# Patient Record
Sex: Female | Born: 1975 | Race: White | Hispanic: No | Marital: Married | Smoking: Never smoker
Health system: Southern US, Community
[De-identification: ages and names within clinical notes are randomized; demographics above are authoritative.]

## PROBLEM LIST (undated history)

## (undated) DIAGNOSIS — C801 Malignant (primary) neoplasm, unspecified: Secondary | ICD-10-CM

## (undated) HISTORY — PX: OTHER SURGICAL HISTORY: SHX169

---

## 2000-03-15 ENCOUNTER — Other Ambulatory Visit: Admission: RE | Admit: 2000-03-15 | Discharge: 2000-03-15 | Payer: Self-pay | Admitting: Obstetrics and Gynecology

## 2000-08-09 ENCOUNTER — Ambulatory Visit (HOSPITAL_COMMUNITY): Admission: RE | Admit: 2000-08-09 | Discharge: 2000-08-09 | Payer: Self-pay | Admitting: Obstetrics and Gynecology

## 2000-11-03 ENCOUNTER — Encounter: Payer: Self-pay | Admitting: Obstetrics and Gynecology

## 2000-11-03 ENCOUNTER — Inpatient Hospital Stay (HOSPITAL_COMMUNITY): Admission: AD | Admit: 2000-11-03 | Discharge: 2000-11-06 | Payer: Self-pay | Admitting: Obstetrics and Gynecology

## 2001-05-14 ENCOUNTER — Other Ambulatory Visit: Admission: RE | Admit: 2001-05-14 | Discharge: 2001-05-14 | Payer: Self-pay | Admitting: Obstetrics and Gynecology

## 2002-03-08 ENCOUNTER — Inpatient Hospital Stay (HOSPITAL_COMMUNITY): Admission: AD | Admit: 2002-03-08 | Discharge: 2002-03-08 | Payer: Self-pay | Admitting: Obstetrics and Gynecology

## 2002-03-08 ENCOUNTER — Encounter: Payer: Self-pay | Admitting: Obstetrics and Gynecology

## 2002-07-22 ENCOUNTER — Ambulatory Visit (HOSPITAL_COMMUNITY): Admission: RE | Admit: 2002-07-22 | Discharge: 2002-07-22 | Payer: Self-pay | Admitting: Obstetrics and Gynecology

## 2002-10-10 ENCOUNTER — Inpatient Hospital Stay (HOSPITAL_COMMUNITY): Admission: AD | Admit: 2002-10-10 | Discharge: 2002-10-13 | Payer: Self-pay | Admitting: Obstetrics and Gynecology

## 2002-11-28 ENCOUNTER — Other Ambulatory Visit: Admission: RE | Admit: 2002-11-28 | Discharge: 2002-11-28 | Payer: Self-pay | Admitting: Obstetrics and Gynecology

## 2003-09-30 ENCOUNTER — Encounter: Admission: RE | Admit: 2003-09-30 | Discharge: 2003-09-30 | Payer: Self-pay | Admitting: Family Medicine

## 2004-02-18 ENCOUNTER — Other Ambulatory Visit: Admission: RE | Admit: 2004-02-18 | Discharge: 2004-02-18 | Payer: Self-pay | Admitting: Obstetrics and Gynecology

## 2010-09-14 ENCOUNTER — Encounter: Payer: Self-pay | Admitting: Gastroenterology

## 2010-10-26 ENCOUNTER — Ambulatory Visit: Payer: Self-pay | Admitting: Gastroenterology

## 2013-01-10 ENCOUNTER — Other Ambulatory Visit: Payer: Self-pay | Admitting: Obstetrics and Gynecology

## 2014-02-26 ENCOUNTER — Other Ambulatory Visit: Payer: Self-pay | Admitting: Obstetrics and Gynecology

## 2014-02-27 LAB — CYTOLOGY - PAP

## 2014-12-14 ENCOUNTER — Emergency Department (HOSPITAL_COMMUNITY)
Admission: EM | Admit: 2014-12-14 | Discharge: 2014-12-15 | Disposition: A | Discharge status: Home / Self Care | Payer: BC Managed Care – PPO | Attending: Emergency Medicine | Admitting: Emergency Medicine

## 2014-12-14 ENCOUNTER — Encounter (HOSPITAL_COMMUNITY): Payer: Self-pay | Admitting: Emergency Medicine

## 2014-12-14 DIAGNOSIS — Y998 Other external cause status: Secondary | ICD-10-CM | POA: Insufficient documentation

## 2014-12-14 DIAGNOSIS — Y9289 Other specified places as the place of occurrence of the external cause: Secondary | ICD-10-CM | POA: Insufficient documentation

## 2014-12-14 DIAGNOSIS — Z79899 Other long term (current) drug therapy: Secondary | ICD-10-CM | POA: Diagnosis not present

## 2014-12-14 DIAGNOSIS — T63004A Toxic effect of unspecified snake venom, undetermined, initial encounter: Secondary | ICD-10-CM | POA: Diagnosis present

## 2014-12-14 DIAGNOSIS — Z23 Encounter for immunization: Secondary | ICD-10-CM | POA: Diagnosis not present

## 2014-12-14 DIAGNOSIS — Z859 Personal history of malignant neoplasm, unspecified: Secondary | ICD-10-CM | POA: Insufficient documentation

## 2014-12-14 DIAGNOSIS — Y9389 Activity, other specified: Secondary | ICD-10-CM | POA: Insufficient documentation

## 2014-12-14 HISTORY — DX: Malignant (primary) neoplasm, unspecified: C80.1

## 2014-12-14 LAB — APTT: aPTT: 28 seconds (ref 24–37)

## 2014-12-14 LAB — CBC
HCT: 36.8 % (ref 36.0–46.0)
Hemoglobin: 12.3 g/dL (ref 12.0–15.0)
MCH: 30.3 pg (ref 26.0–34.0)
MCHC: 33.4 g/dL (ref 30.0–36.0)
MCV: 90.6 fL (ref 78.0–100.0)
Platelets: 335 10*3/uL (ref 150–400)
RBC: 4.06 MIL/uL (ref 3.87–5.11)
RDW: 12.8 % (ref 11.5–15.5)
WBC: 9.7 10*3/uL (ref 4.0–10.5)

## 2014-12-14 LAB — BASIC METABOLIC PANEL
Anion gap: 8 (ref 5–15)
BUN: 16 mg/dL (ref 6–20)
CO2: 20 mmol/L — ABNORMAL LOW (ref 22–32)
Calcium: 9.2 mg/dL (ref 8.9–10.3)
Chloride: 110 mmol/L (ref 101–111)
Creatinine, Ser: 0.78 mg/dL (ref 0.44–1.00)
GFR calc Af Amer: >60 mL/min (ref >60)
GFR calc non Af Amer: >60 mL/min (ref >60)
Glucose, Bld: 124 mg/dL — ABNORMAL HIGH (ref 65–99)
Potassium: 3.5 mmol/L (ref 3.5–5.1)
Sodium: 138 mmol/L (ref 135–145)

## 2014-12-14 LAB — PROTIME-INR
INR: 0.89 (ref 0.00–1.49)
PROTHROMBIN TIME: 12.3 s (ref 11.6–15.2)

## 2014-12-14 LAB — ABO/RH: ABO/RH(D): A NEG

## 2014-12-14 LAB — FIBRINOGEN: Fibrinogen: 310 mg/dL (ref 204–475)

## 2014-12-14 MED ORDER — LORAZEPAM 1 MG PO TABS
1.0000 mg | ORAL_TABLET | Freq: Once | ORAL | Status: AC
  Administered 2014-12-14: 1 mg via ORAL
  Filled 2014-12-14: qty 1

## 2014-12-14 MED ORDER — LORAZEPAM 2 MG/ML IJ SOLN: 1.0000 mg | Freq: Once | INTRAMUSCULAR | Status: DC

## 2014-12-14 MED ORDER — SODIUM CHLORIDE 0.9 % IV SOLN
Freq: Once | INTRAVENOUS | Status: AC
  Administered 2014-12-14: 21:00:00 via INTRAVENOUS

## 2014-12-14 MED ORDER — OXYCODONE-ACETAMINOPHEN 5-325 MG PO TABS: 1.0000 | ORAL_TABLET | ORAL | Status: AC | PRN

## 2014-12-14 MED ORDER — MORPHINE SULFATE (PF) 4 MG/ML IV SOLN
4.0000 mg | Freq: Once | INTRAVENOUS | Status: AC
  Administered 2014-12-14: 4 mg via INTRAVENOUS
  Filled 2014-12-14: qty 1

## 2014-12-14 NOTE — Discharge Instructions (Signed)
Snake Bite °Snakes may be either venomous (containing poison) or nonvenomous (nonpoisonous). A nonvenomous snake bite will cause trauma or a wound to the skin and possibly the deeper tissues. A venomous snake will also cause a traumatic wound, but more importantly, it may have injected venom into the wound. Snake bite venom can be extremely serious and even deadly. One type of venom may cause major skin, tissue and muscle damage, and failure of normal blood clotting. This may cause extreme swelling and pain of the affected area. Another type of venom can affect the brain and nervous system and may cause death. The treatment for venomous snake bite may require the use of antivenom medicine. If you are unsure if your bite is from a venomous snake, you MUST seek immediate medical attention. °YOU MIGHT NEED A TETANUS SHOT NOW IF: °· You have no idea when you had the last one. °· You have never had a tetanus shot before. °· The bite broke your skin. °If you need a tetanus shot, and you decide not to get one, there is a rare chance of getting tetanus. Sickness from tetanus can be serious. °HOME CARE INSTRUCTIONS  °A snake bit you and caused a skin wound. It may or may not have been venomous. If the snake was venomous, a small amount of venom may have been injected into your skin. °· Keep the bite area clean and dry. °· Keep the extremity elevated above the level of the heart for the next 48 hours. °· Wash the bite area 3 times daily with soap and water or an antiseptic. Apply an adhesive or gauze bandage to the bite area. °· If you develop blistering of any type at the site of the bite, protect the blisters from breaking. Do not attempt to open it. °· If you were given a tetanus shot, your arm may get swollen, red and warm at the shot site. This is a common response to the injection. °SEEK IMMEDIATE MEDICAL CARE IF:  °· You develop symptoms of poisoning including increased pain, redness, swelling, blood blisters or purple  spots in the bite area, nausea, vomiting, numbness, tingling, excessive sweating, breathing difficulty, blurred vision, feelings of lightheadedness, or feeling faint. If you develop symptoms of poisoning, you MUST seek immediate medical attention. °· The bite becomes infected. Symptoms may include redness, swelling, pain, tenderness, pus, red streaks running from the wound, or an oral temperature above 102° F (38.9° C), not controlled by medicine. °· Your condition or wound becomes worse. °MAKE SURE YOU:  °· Understand these instructions. °· Will watch your condition. °· Will get help right away if you are not doing well or get worse. °Document Released: 02/26/2000 Document Revised: 05/23/2011 Document Reviewed: 07/22/2009 °ExitCare® Patient Information ©2015 [employer]. This information is not intended to replace advice given to you by your health care provider. Make sure you discuss any questions you have with your health care provider. ° °

## 2014-12-14 NOTE — ED Notes (Signed)
Pt presents from home after being bitten by a copperhead at about 7:35pm.  Bite is visible in inner left foot.  9/10 pain.

## 2014-12-14 NOTE — ED Provider Notes (Signed)
CSN: [id number]     Arrival date & time 12/14/14  2002 History   First MD Initiated Contact with Patient 12/14/14 2031     Chief Complaint  Patient presents with  . Snake Bite    CopperHead     (Consider location/radiation/quality/duration/timing/severity/associated sxs/prior Treatment) HPI   39yF with snakebite to L foot. Happened at [place] today. Was wearing sandals and bitten area was exposed. Husband got a good look at the snake and is confident it was a copperhead. Local pain and swelling which has since spread up into calf. Denies pain anywhere else. No numbness or tingling. No GI complaints. No dizziness, lightheadedness or SOB. Otherwise very healthy.   Past Medical History  Diagnosis Date  . Cancer [facility])    Past Surgical History  Procedure Laterality Date  . Cesarean section      x 2  . Skin cancer removal     No family history on file. Social History  Substance Use Topics  . Smoking status: Never Smoker   . Smokeless tobacco: Never Used  . Alcohol Use: Yes     Comment: occasional   OB History    No data available     Review of Systems  All systems reviewed and negative, other than as noted in HPI.   Allergies  Review of patient's allergies indicates no known allergies.  Home Medications   Prior to Admission medications   Medication Sig Start Date End Date Taking? Authorizing Provider  ALPRAZolam Duanne Moron) 1 MG tablet Take 0.5-1 mg by mouth every 12 (twelve) hours as needed for anxiety or sleep.  11/03/14  Yes Historical Provider, MD  Multiple Vitamins-Minerals (MULTIVITAMIN ADULT PO) Take 1 tablet by mouth daily.   Yes Historical Provider, MD   BP 111/62 mmHg  Pulse 87  Temp(Src) 97.8 F (36.6 C) (Oral)  Resp 20  Ht 5\' 3"  (1.6 m)  Wt 200 lb (90.719 kg)  BMI 35.44 kg/m2  SpO2 99%  LMP 12/12/2014 (Exact Date) Physical Exam  Constitutional: She appears well-developed and well-nourished. No distress.  HENT:  Head: Normocephalic and atraumatic.  Eyes:  Conjunctivae are normal. Right eye exhibits no discharge. Left eye exhibits no discharge.  Neck: Neck supple.  Cardiovascular: Normal rate, regular rhythm and normal heart sounds.  Exam reveals no gallop and no friction rub.   No murmur heard. Pulmonary/Chest: Effort normal and breath sounds normal. No respiratory distress.  Abdominal: Soft. She exhibits no distension. There is no tenderness.  Musculoskeletal: She exhibits no edema or tenderness.  Neurological: She is alert.  Skin: Skin is warm and dry.  Two punctate wounds consistent with snake bite to medial aspect L foot. Locally tender. No bleeding. 3-4 cm of faint ecchymosis surrounding this. Entire foot is moderately swollen. Mild swelling extending to proximal calf area. Able to wiggle toes. Palpable DP pulse.   Psychiatric: She has a normal mood and affect. Her behavior is normal. Thought content normal.  Nursing note and vitals reviewed.   ED Course  Procedures (including critical care time) Labs Review Labs Reviewed  BASIC METABOLIC PANEL - Abnormal; Notable for the following:    CO2 20 (*)    Glucose, Bld 124 (*)    All other components within normal limits  CBC WITH DIFFERENTIAL/PLATELET - Abnormal; Notable for the following:    WBC 12.9 (*)    Neutro Abs 10.9 (*)    All other components within normal limits  CBC  PROTIME-INR  APTT  FIBRINOGEN  PROTIME-INR  APTT  FIBRINOGEN  TYPE AND SCREEN  ABO/RH    Imaging Review No results found. I have personally reviewed and evaluated these images and lab results as part of my medical decision-making.   EKG Interpretation None      MDM   Final diagnoses:  Snake bite, undetermined intent, initial encounter   Currently with score of 2. Labs pending. IV placed. Pain meds. Continue to closely monitor.   Criteria  Pulmonary Symptoms:  0 - No symptoms/signs  1 - Dyspnea, minimal chest tightness, mild or vague discomfort, or respirations of 20-25 breaths/minute  2 -  Moderate respiratory distress (tachypnea, 26-40 breaths/minute; accessory muscle use)  3 - Cyanosis, air hunger, extreme tachypnea, or respiratory insufficiency/failure (3)  Cardiovascular Symptoms:  0 - No symptoms/signs  1 - Tachycardia (100-125 BPM), palpitations, generalized weakness, benign dysrhythmia, or hypotension  2 - Tachycardia (126-175 BPM) or hypotension, with SBP > 100 mmHg  3 - Extreme Tachycarida (>175 BPM), hypotension with SBP < 100 mmHg, malignant dysrhythmia, or cardiac arrest  Local Wound:  0 - No symptoms/signs  1 - Pain, swelling, or ecchymosis within 5-7.5cm of bite site  2 - Pain, swelling, or ecchymosis involving less than half the extremity (7.5-50cm from bite site)  3 - Pain, swelling, or ecchymosis involving half to all of the extremity (50-100cm from bite site)  4 - Pain, swelling, or ecchymosis extending beyond affected extremity (more than 100cm from the bite site)  Gastrointestinal System:  0 - No symptoms/signs  1 - Pain, tenesmus or nausea  2 - Vomiting or diarrhea  3 - Repeated vomiting, diarrhea, hematemesis, or hematochezia  Hematologic Symptoms:  0 - No symptoms/signs  1 - Coagulation parameters slightly abnormal: PT <20 sec, PTT <50 sec, PLT 100-150K/ml, or fibrinogen 100-150 mcg/ml  2 - Coagulation parameters abnormal: PT <20-25 sec, PTT <50-75 sec, PLT 50-100K/ml, or fibrinogen 50-100 mcg/ml  3 - Coagulation parameters abnormal: PT <50-100 sec, PTT <75-100 sec, PLT 20-50K/ml, or fibrinogen 50 mcg/ml  4 - Coagulation parameters markedly abnormal with serious bleeding or the threat of spontaneous bleeding: unmeasurable PT or PTT, PLT <20K/ml, or undetectable fibrinogen; severe abnomalities of other laboratory values also fall into this category  Central Nervous System:  0 - No symptoms/signs  1 - Minimal apprehension, headache, weakness, dizziness, chills, or paresthesia  2 - Moderate apprehension, headache, weakness, dizziness, chills, paresthesia,  confusion, or fasciculation in area of bite site  3 - Severe confusion, lethargy, seizures, coma, psychosis, or generalized fasciculation  Score:   Severity of Envenomation   Snakebite Severity Score  0-3 (Minimal)  4-7 (Moderate)  8-20 (Severe)   CroFab Indicated?  No  Yes  Yes; may require multiple doses       9:38 PM Symptoms have remained stable since arrival. No appreciable change in physical exam. Labs normal. Will continue to observe. Poison control recommending repeat labs at 6 hours post-bite which would be 0130.   10:27 PM Exam unchanged. Resting comfortably.   11:38 PM Feels like pain getting somewhat better. No new complaints. Continued plan for repeat labs at 6 hours. Likely DC home then assuming no significant abnormalities.   Virgel Manifold, MD 12/17/14 1440

## 2014-12-15 LAB — PROTIME-INR
INR: 1.06 (ref 0.00–1.49)
Prothrombin Time: 14 seconds (ref 11.6–15.2)

## 2014-12-15 LAB — CBC WITH DIFFERENTIAL/PLATELET
Basophils Absolute: 0 10*3/uL (ref 0.0–0.1)
Basophils Relative: 0 %
Eosinophils Absolute: 0 10*3/uL (ref 0.0–0.7)
Eosinophils Relative: 0 %
HCT: 37.8 % (ref 36.0–46.0)
Hemoglobin: 12.5 g/dL (ref 12.0–15.0)
Lymphocytes Relative: 11 %
Lymphs Abs: 1.4 10*3/uL (ref 0.7–4.0)
MCH: 31 pg (ref 26.0–34.0)
MCHC: 33.1 g/dL (ref 30.0–36.0)
MCV: 93.8 fL (ref 78.0–100.0)
Monocytes Absolute: 0.5 10*3/uL (ref 0.1–1.0)
Monocytes Relative: 4 %
Neutro Abs: 10.9 10*3/uL — ABNORMAL HIGH (ref 1.7–7.7)
Neutrophils Relative %: 85 %
Platelets: 275 10*3/uL (ref 150–400)
RBC: 4.03 MIL/uL (ref 3.87–5.11)
RDW: 13 % (ref 11.5–15.5)
WBC: 12.9 10*3/uL — ABNORMAL HIGH (ref 4.0–10.5)

## 2014-12-15 LAB — APTT: aPTT: 31 seconds (ref 24–37)

## 2014-12-15 LAB — TYPE AND SCREEN
ABO/RH(D): A NEG
Antibody Screen: NEGATIVE

## 2014-12-15 LAB — FIBRINOGEN: Fibrinogen: 268 mg/dL (ref 204–475)

## 2014-12-15 MED ORDER — KETOROLAC TROMETHAMINE 30 MG/ML IJ SOLN
30.0000 mg | Freq: Once | INTRAMUSCULAR | Status: AC
  Administered 2014-12-15: 30 mg via INTRAVENOUS
  Filled 2014-12-15: qty 1

## 2014-12-15 MED ORDER — SODIUM CHLORIDE 0.9 % IV SOLN
Freq: Once | INTRAVENOUS | Status: AC
  Administered 2014-12-15: 03:00:00 via INTRAVENOUS

## 2014-12-15 MED ORDER — SODIUM CHLORIDE 0.9 % IV BOLUS (SEPSIS): 1000.0000 mL | Freq: Once | INTRAVENOUS | Status: AC

## 2014-12-15 MED ORDER — TETANUS-DIPHTH-ACELL PERTUSSIS 5-2.5-18.5 LF-MCG/0.5 IM SUSP
0.5000 mL | Freq: Once | INTRAMUSCULAR | Status: AC
  Administered 2014-12-15: 0.5 mL via INTRAMUSCULAR
  Filled 2014-12-15: qty 0.5

## 2014-12-15 NOTE — ED Provider Notes (Signed)
CSN: [id number]     Arrival date & time 12/14/14  2002 History   First MD Initiated Contact with Patient 12/14/14 2031     Chief Complaint  Patient presents with  . [place]     (Consider location/radiation/quality/duration/timing/severity/associated sxs/prior Treatment) Patient is a 39 y.o. female presenting with animal bite. The history is provided by the patient.  Animal Bite Contact animal:  Snake Location:  Foot Foot injury location:  Top of L foot (instep) Pain details:    Quality:  Aching   Severity:  Mild   Timing:  Constant   Progression:  Improving Incident location:  Outside Notifications:  None Animal's rabies vaccination status:  Out of date Animal in possession: no   Tetanus status:  Out of date Relieved by:  Nothing Worsened by:  Nothing tried Ineffective treatments:  None tried Associated symptoms: no numbness     Past Medical History  Diagnosis Date  . Cancer [facility])    Past Surgical History  Procedure Laterality Date  . Cesarean section      x 2  . Skin cancer removal     No family history on file. Social History  Substance Use Topics  . Smoking status: Never Smoker   . Smokeless tobacco: Never Used  . Alcohol Use: Yes     Comment: occasional   OB History    No data available     Review of Systems  Neurological: Negative for numbness.  All other systems reviewed and are negative.     Allergies  Review of patient's allergies indicates no known allergies.  Home Medications   Prior to Admission medications   Medication Sig Start Date End Date Taking? Authorizing Provider  ALPRAZolam Duanne Moron) 1 MG tablet Take 0.5-1 mg by mouth every 12 (twelve) hours as needed for anxiety or sleep.  11/03/14  Yes Historical Provider, MD  Multiple Vitamins-Minerals (MULTIVITAMIN ADULT PO) Take 1 tablet by mouth daily.   Yes Historical Provider, MD  oxyCODONE-acetaminophen (PERCOCET/ROXICET) 5-325 MG tablet Take 1-2 tablets by mouth every 4  (four) hours as needed for severe pain. 12/14/14   Virgel Manifold, MD   BP 90/64 mmHg  Pulse 88  Temp(Src) 99 F (37.2 C) (Oral)  Resp 20  Ht 5\' 3"  (1.6 m)  Wt 200 lb (90.719 kg)  BMI 35.44 kg/m2  SpO2 100%  LMP 12/12/2014 (Exact Date) Physical Exam  Constitutional: She is oriented to person, place, and time. She appears well-developed and well-nourished.  HENT:  Head: Normocephalic and atraumatic.  Eyes: EOM are normal.  Neck: Normal range of motion. Neck supple.  Cardiovascular: Intact distal pulses.   Musculoskeletal: She exhibits edema.       Left knee: Normal.       Left ankle: She exhibits swelling. She exhibits no ecchymosis. Achilles tendon normal.       Left upper leg: Normal.       Left lower leg: She exhibits edema. She exhibits no bony tenderness, no deformity and no laceration.       Left foot: There is swelling. There is normal range of motion, no bony tenderness, normal capillary refill, no crepitus, no deformity and no laceration.       Feet:  All compartments of the LLE are soft. Same in color and temperature no pulselesless no pain with passive or active stretch.  Sensation is intact to all nerve distributions.    Neurological: She is alert and oriented to person, place, and time.  She has normal reflexes.  Skin: Skin is warm and dry.  Psychiatric: She has a normal mood and affect.    ED Course  Procedures (including critical care time) Labs Review Labs Reviewed  BASIC METABOLIC PANEL - Abnormal; Notable for the following:    CO2 20 (*)    Glucose, Bld 124 (*)    All other components within normal limits  CBC WITH DIFFERENTIAL/PLATELET - Abnormal; Notable for the following:    WBC 12.9 (*)    Neutro Abs 10.9 (*)    All other components within normal limits  CBC  PROTIME-INR  APTT  FIBRINOGEN  PROTIME-INR  APTT  FIBRINOGEN  TYPE AND SCREEN  ABO/RH    Imaging Review No results found. I have personally reviewed and evaluated these images and lab  results as part of my medical decision-making.   EKG Interpretation None      MDM   Final diagnoses:  Snake bite, undetermined intent, initial encounter   Calves measured.   Per poison control no indication for crofab patient is improved from a pain standpoint.  Will d/c home with pain medication with strict lower extremity return precautions.  Patient and husband verbalize understanding and agree to follow up    Goran Olden, MD 12/15/14 4388

## 2014-12-15 NOTE — ED Notes (Signed)
Poison control was called and this RN reviewed pt labs with them. They determined pt is stable enough to be discharged. PC will follow up with pt in 12 hours.

## 2019-03-22 ENCOUNTER — Ambulatory Visit: Payer: BC Managed Care – PPO | Attending: Internal Medicine

## 2019-03-22 DIAGNOSIS — Z20822 Contact with and (suspected) exposure to covid-19: Secondary | ICD-10-CM

## 2019-03-23 LAB — NOVEL CORONAVIRUS, NAA: SARS-CoV-2, NAA: NOT DETECTED

## 2019-04-18 ENCOUNTER — Ambulatory Visit: Payer: BC Managed Care – PPO | Attending: Internal Medicine

## 2019-04-18 DIAGNOSIS — Z20822 Contact with and (suspected) exposure to covid-19: Secondary | ICD-10-CM

## 2019-04-19 LAB — NOVEL CORONAVIRUS, NAA: SARS-CoV-2, NAA: NOT DETECTED

## 2019-05-11 ENCOUNTER — Ambulatory Visit: Payer: BC Managed Care – PPO | Attending: Internal Medicine

## 2019-05-11 DIAGNOSIS — Z23 Encounter for immunization: Secondary | ICD-10-CM | POA: Insufficient documentation

## 2019-05-11 NOTE — Progress Notes (Signed)
   Covid-19 Vaccination Clinic  Name:  April Conley    MRN: [id number] DOB: [birth year]  05/11/2019  [name] was observed post Covid-19 immunization for 15 minutes without incidence. She was provided with Vaccine Information Sheet and instruction to access the V-Safe system.   [name] was instructed to call 911 with any severe reactions post vaccine: Marland Kitchen Difficulty breathing  . Swelling of your face and throat  . A fast heartbeat  . A bad rash all over your body  . Dizziness and weakness    Immunizations Administered    Name Date Dose VIS Date Route   Pfizer COVID-19 Vaccine 05/11/2019  6:20 PM 0.3 mL 02/22/2019 Intramuscular   Manufacturer: [place]   Lot: [id number]   [place]: [id number]

## 2019-06-01 ENCOUNTER — Ambulatory Visit: Payer: BC Managed Care – PPO | Attending: Internal Medicine

## 2019-06-01 DIAGNOSIS — Z23 Encounter for immunization: Secondary | ICD-10-CM

## 2019-06-01 NOTE — Progress Notes (Signed)
   Covid-19 Vaccination Clinic  Name:  April Conley    MRN: [id number] DOB: 06/03/1975  06/01/2019  April Conley was observed post Covid-19 immunization for 15 minutes without incident. She was provided with Vaccine Information Sheet and instruction to access the V-Safe system.   April Conley was instructed to call 911 with any severe reactions post vaccine: Marland Kitchen Difficulty breathing  . Swelling of face and throat  . A fast heartbeat  . A bad rash all over body  . Dizziness and weakness   Immunizations Administered    Name Date Dose VIS Date Route   Pfizer COVID-19 Vaccine 06/01/2019 10:24 AM 0.3 mL 02/22/2019 Intramuscular   Manufacturer: [place]   Lot: [id number]   [place]: [id number]

## 2019-08-27 ENCOUNTER — Other Ambulatory Visit: Payer: Self-pay

## 2019-08-27 ENCOUNTER — Encounter (HOSPITAL_BASED_OUTPATIENT_CLINIC_OR_DEPARTMENT_OTHER): Payer: Self-pay

## 2019-08-27 ENCOUNTER — Emergency Department (HOSPITAL_BASED_OUTPATIENT_CLINIC_OR_DEPARTMENT_OTHER)
Admission: EM | Admit: 2019-08-27 | Discharge: 2019-08-27 | Disposition: A | Discharge status: Home / Self Care | Payer: BC Managed Care – PPO | Attending: Emergency Medicine | Admitting: Emergency Medicine

## 2019-08-27 DIAGNOSIS — R21 Rash and other nonspecific skin eruption: Secondary | ICD-10-CM | POA: Diagnosis not present

## 2019-08-27 MED ORDER — TRIAMCINOLONE ACETONIDE 0.1 % EX CREA
1.0000 | TOPICAL_CREAM | Freq: Three times a day (TID) | CUTANEOUS | 0 refills | Status: AC
Start: 2019-08-27 — End: ?

## 2019-08-27 MED ORDER — DOXYCYCLINE HYCLATE 100 MG PO CAPS: 100.0000 mg | ORAL_CAPSULE | Freq: Two times a day (BID) | ORAL | 0 refills | Status: AC

## 2019-08-27 MED ORDER — KETOCONAZOLE 2 % EX CREA: 1.0000 "application " | TOPICAL_CREAM | Freq: Two times a day (BID) | CUTANEOUS | 0 refills | Status: AC

## 2019-08-27 NOTE — Discharge Instructions (Signed)
Get help right away if you: Have a fever and your symptoms suddenly get worse. Develop confusion. Have a severe headache or a stiff neck. Have severe joint pains or stiffness. Have a seizure. Develop a rash that covers all or most of your body. The rash may or may not be painful. Develop blisters that: Are on top of the rash. Grow larger or grow together. Are painful. Are inside your nose or mouth. Develop a rash that: Looks like purple pinprick-sized spots all over your body. Has a "bull's eye" or looks like a target. Is not related to sun exposure, is red and painful, and causes your skin to peel. 

## 2019-08-27 NOTE — ED Provider Notes (Signed)
[place] [facility] EMERGENCY DEPARTMENT Provider Note   CSN: [id number] Arrival date & time: 08/27/19  1740     History Chief Complaint  Patient presents with  . Rash    April Conley is a 44 y.o. female presents emergency department with chief complaint of rash.  She had onset of the rash 10 days ago.  Her husband noticed that she had some bruising and a circular rash on her thigh.  Patient states that when she took of her bathing suit later she also noticed it was on the abdominal fold that touches her thigh as well.  Since that time she has had progressively worsening rash over the right lateral abdominal wall.  It spares the pannicular fold.  She states it is intensely itchy and she has been using hydrocortisone cream without significant relief.  She states it is minimally tender for her to touch.  She denies fever or chills.  HPI     Past Medical History:  Diagnosis Date  . Cancer ([place])     There are no problems to display for this patient.   Past Surgical History:  Procedure Laterality Date  . CESAREAN SECTION     x 2  . skin cancer removal       OB History   No obstetric history on file.     No family history on file.  Social History   Tobacco Use  . Smoking status: Never Smoker  . Smokeless tobacco: Never Used  Vaping Use  . Vaping Use: Never used  Substance Use Topics  . Alcohol use: Yes    Comment: occasional  . Drug use: No    Home Medications Prior to Admission medications   Medication Sig Start Date End Date Taking? Authorizing Provider  ALPRAZolam Duanne Moron) 1 MG tablet Take 0.5-1 mg by mouth every 12 (twelve) hours as needed for anxiety or sleep.  11/03/14   [provider]  Multiple Vitamins-Minerals (MULTIVITAMIN ADULT PO) Take 1 tablet by mouth daily.    [provider]  oxyCODONE-acetaminophen (PERCOCET/ROXICET) 5-325 MG tablet Take 1-2 tablets by mouth every 4 (four) hours as needed for severe pain. 12/14/14    Virgel Manifold, MD    Allergies    Patient has no known allergies.  Review of Systems   Review of Systems Ten systems reviewed and are negative for acute change, except as noted in the HPI.   Physical Exam Updated Vital Signs BP 136/78 (BP Location: Right Arm)   Pulse 97   Temp 98.6 F (37 C) (Oral)   Resp 19   Ht 5\' 3"  (1.6 m)   Wt 102.1 kg   LMP 08/11/2019   SpO2 100%   BMI 39.86 kg/m   Physical Exam Vitals and nursing note reviewed.  Constitutional:      General: She is not in acute distress.    Appearance: She is well-developed. She is not diaphoretic.  HENT:     Head: Normocephalic and atraumatic.  Eyes:     General: No scleral icterus.    Conjunctiva/sclera: Conjunctivae normal.  Cardiovascular:     Rate and Rhythm: Normal rate and regular rhythm.     Heart sounds: Normal heart sounds. No murmur heard.  No friction rub. No gallop.   Pulmonary:     Effort: Pulmonary effort is normal. No respiratory distress.     Breath sounds: Normal breath sounds.  Abdominal:     General: Bowel sounds are normal. There is no distension.  Palpations: Abdomen is soft. There is no mass.     Tenderness: There is no abdominal tenderness. There is no guarding.  Musculoskeletal:     Cervical back: Normal range of motion.  Skin:    General: Skin is warm and dry.     Comments: Large area of confluent erythematous maculopapular rash over the left side of the abdomen with excoriation. There is a well demarcated raised erythematous medial border.  The intratracheal region is spared.  There is a large erythematous circular region with violaceous border, central clearing on the left thigh  Neurological:     Mental Status: She is alert and oriented to person, place, and time.  Psychiatric:        Behavior: Behavior normal.     ED Results / Procedures / Treatments   Labs (all labs ordered are listed, but only abnormal results are displayed) Labs Reviewed - No data to  display  EKG None  Radiology No results found.  Procedures Procedures (including critical care time)  Medications Ordered in ED Medications - No data to display  ED Course  I have reviewed the triage vital signs and the nursing notes.  Pertinent labs & imaging results that were available during my care of the patient were reviewed by me and considered in my medical decision making (see chart for details).    MDM Rules/Calculators/A&P                          Patient with a rash.  Question if this may be fungal.  I have lower suspicion for cellulitis that is minimally tender.  It appears to affected areas that are touching clothing.  The pannicular fold is spared.  Does seem to be a potential fungal component.  Patient will be discharged with Kenalog, ketoconazole.  Doxycycline to hold if it is not improving of the next few days.  She has an established relationship with [facility] dermatology and will make an appointment to follow-up as soon as possible.  Discussed return precautions. Final Clinical Impression(s) / ED Diagnoses Final diagnoses:  None    Rx / DC Orders ED Discharge Orders    None       Margarita Mail, PA-C 08/28/19 0011    Davonna Belling, MD 09/02/19 [phone]

## 2019-08-27 NOTE — ED Triage Notes (Signed)
PT has a red pruritic rash to L side of abd extending to top of L thigh. Pt reports the rash is also tender.

## 2019-09-17 ENCOUNTER — Other Ambulatory Visit: Payer: Self-pay | Admitting: Obstetrics and Gynecology

## 2019-09-17 DIAGNOSIS — R928 Other abnormal and inconclusive findings on diagnostic imaging of breast: Secondary | ICD-10-CM

## 2019-09-30 ENCOUNTER — Other Ambulatory Visit: Payer: Self-pay | Admitting: Obstetrics and Gynecology

## 2019-09-30 ENCOUNTER — Other Ambulatory Visit: Payer: Self-pay

## 2019-09-30 ENCOUNTER — Ambulatory Visit
Admission: RE | Admit: 2019-09-30 | Discharge: 2019-09-30 | Disposition: A | Discharge status: Home / Self Care | Payer: BC Managed Care – PPO | Source: Ambulatory Visit | Attending: Obstetrics and Gynecology | Admitting: Obstetrics and Gynecology

## 2019-09-30 DIAGNOSIS — R928 Other abnormal and inconclusive findings on diagnostic imaging of breast: Secondary | ICD-10-CM

## 2019-09-30 DIAGNOSIS — N6310 Unspecified lump in the right breast, unspecified quadrant: Secondary | ICD-10-CM

## 2019-11-22 ENCOUNTER — Other Ambulatory Visit: Payer: BC Managed Care – PPO

## 2019-11-22 ENCOUNTER — Other Ambulatory Visit: Payer: Self-pay

## 2019-11-22 DIAGNOSIS — Z20822 Contact with and (suspected) exposure to covid-19: Secondary | ICD-10-CM

## 2019-11-25 LAB — NOVEL CORONAVIRUS, NAA: SARS-CoV-2, NAA: NOT DETECTED

## 2020-04-02 ENCOUNTER — Other Ambulatory Visit: Payer: Self-pay

## 2020-04-02 ENCOUNTER — Other Ambulatory Visit: Payer: Self-pay | Admitting: Obstetrics and Gynecology

## 2020-04-02 ENCOUNTER — Ambulatory Visit
Admission: RE | Admit: 2020-04-02 | Discharge: 2020-04-02 | Disposition: A | Discharge status: Home / Self Care | Payer: BC Managed Care – PPO | Source: Ambulatory Visit | Attending: Obstetrics and Gynecology | Admitting: Obstetrics and Gynecology

## 2020-04-02 DIAGNOSIS — N6310 Unspecified lump in the right breast, unspecified quadrant: Secondary | ICD-10-CM

## 2020-09-30 ENCOUNTER — Other Ambulatory Visit: Payer: PRIVATE HEALTH INSURANCE

## 2020-10-08 ENCOUNTER — Ambulatory Visit
Admission: RE | Admit: 2020-10-08 | Discharge: 2020-10-08 | Disposition: A | Discharge status: Home / Self Care | Payer: PRIVATE HEALTH INSURANCE | Source: Ambulatory Visit | Attending: Obstetrics and Gynecology | Admitting: Obstetrics and Gynecology

## 2020-10-08 ENCOUNTER — Other Ambulatory Visit: Payer: Self-pay

## 2020-10-08 ENCOUNTER — Ambulatory Visit
Admission: RE | Admit: 2020-10-08 | Discharge: 2020-10-08 | Disposition: A | Discharge status: Home / Self Care | Payer: BC Managed Care – PPO | Source: Ambulatory Visit | Attending: Obstetrics and Gynecology | Admitting: Obstetrics and Gynecology

## 2020-10-08 DIAGNOSIS — N6310 Unspecified lump in the right breast, unspecified quadrant: Secondary | ICD-10-CM

## 2021-10-29 ENCOUNTER — Other Ambulatory Visit: Payer: Self-pay | Admitting: Obstetrics and Gynecology

## 2021-10-29 DIAGNOSIS — Z09 Encounter for follow-up examination after completed treatment for conditions other than malignant neoplasm: Secondary | ICD-10-CM

## 2021-11-04 ENCOUNTER — Other Ambulatory Visit: Payer: Self-pay | Admitting: Obstetrics and Gynecology

## 2021-11-04 DIAGNOSIS — Z09 Encounter for follow-up examination after completed treatment for conditions other than malignant neoplasm: Secondary | ICD-10-CM

## 2021-11-26 ENCOUNTER — Ambulatory Visit
Admission: RE | Admit: 2021-11-26 | Discharge: 2021-11-26 | Disposition: A | Discharge status: Home / Self Care | Payer: BC Managed Care – PPO | Source: Ambulatory Visit | Attending: Obstetrics and Gynecology | Admitting: Obstetrics and Gynecology

## 2021-11-26 DIAGNOSIS — Z09 Encounter for follow-up examination after completed treatment for conditions other than malignant neoplasm: Secondary | ICD-10-CM

## 2021-12-20 ENCOUNTER — Other Ambulatory Visit: Payer: Self-pay | Admitting: Obstetrics and Gynecology

## 2022-11-29 IMAGING — MG DIGITAL DIAGNOSTIC BILAT W/ TOMO W/ CAD
8 of 14 series · 8 of 40 positions shown · non-contrast
Comparison: Previous exam(s).

CLINICAL DATA: 45-year-old female presenting for annual exam as
well as follow-up of a probably benign right breast mass.

EXAM:
DIGITAL DIAGNOSTIC BILATERAL MAMMOGRAM WITH TOMOSYNTHESIS AND CAD;
ULTRASOUND RIGHT BREAST LIMITED
TECHNIQUE: Bilateral digital diagnostic mammography and breast tomosynthesis
was performed. The images were evaluated with computer-aided
detection.; Targeted ultrasound examination of the right breast was
performed

[R CC synth-2D (1 of 3)]
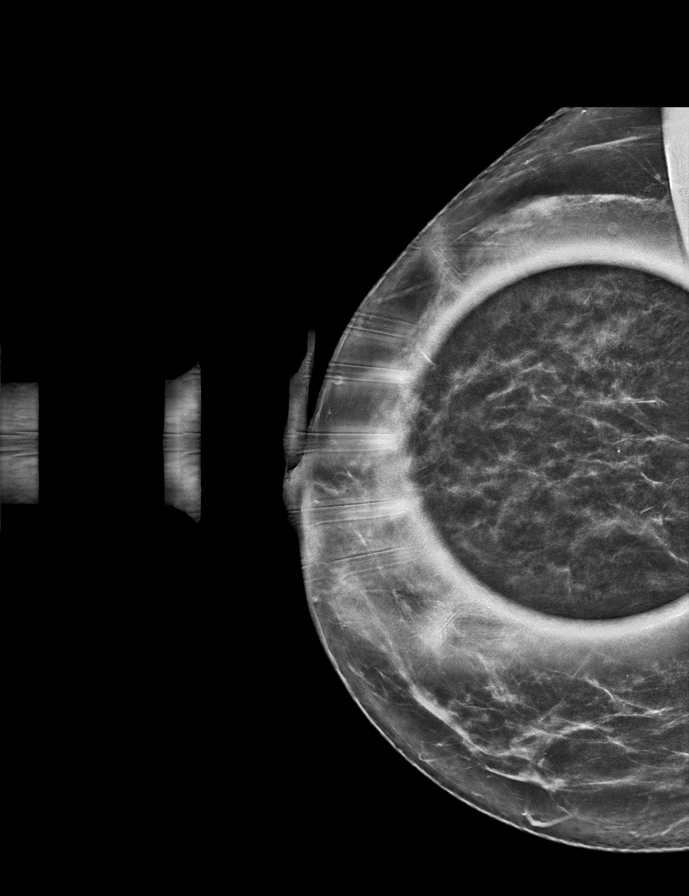

[R ML synth-2D]
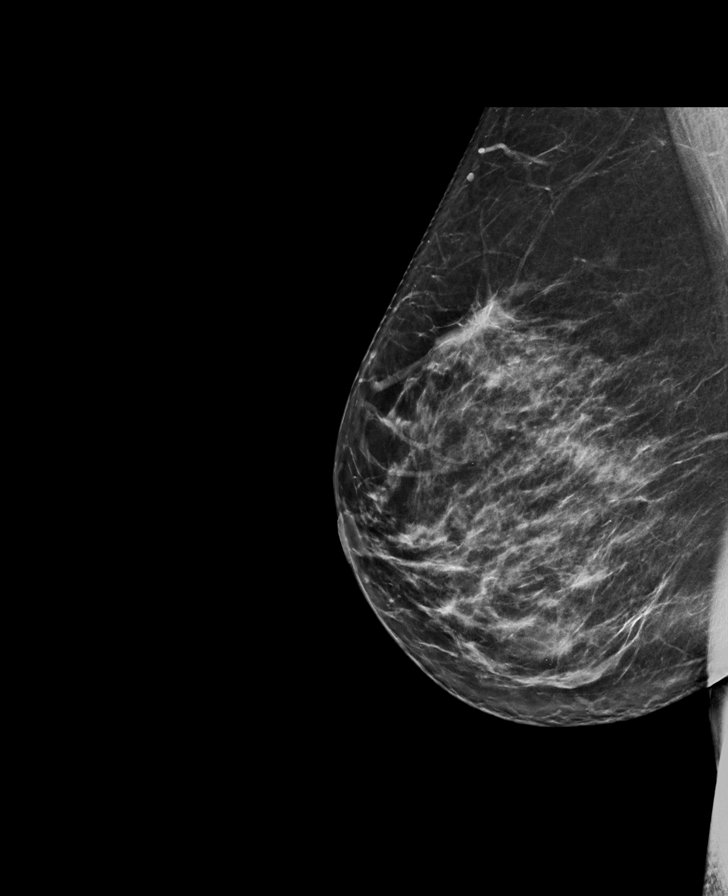

[L MLO synth-2D]
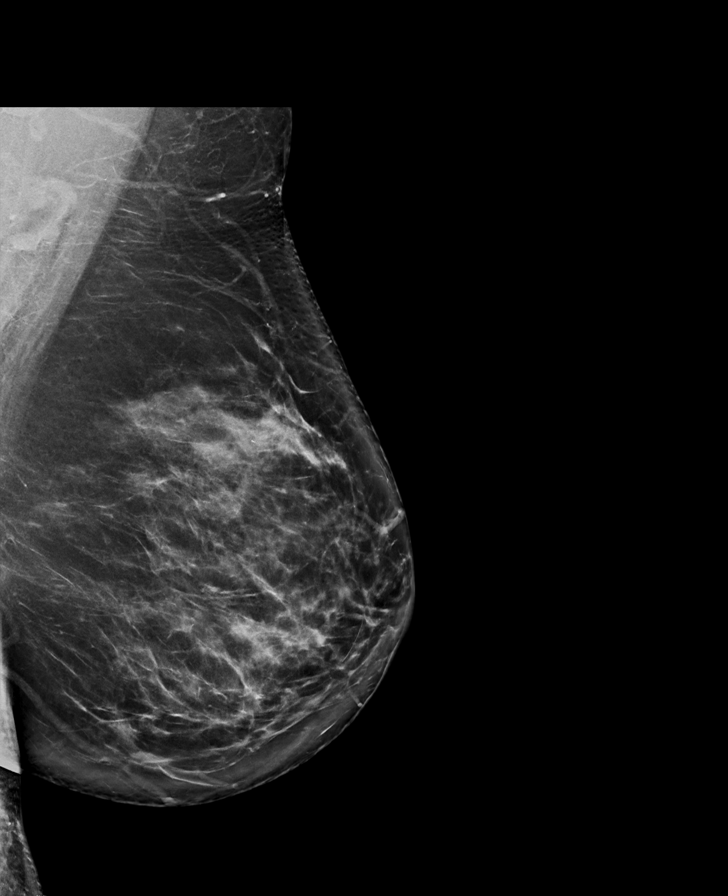

[R CC synth-2D (2 of 3)]
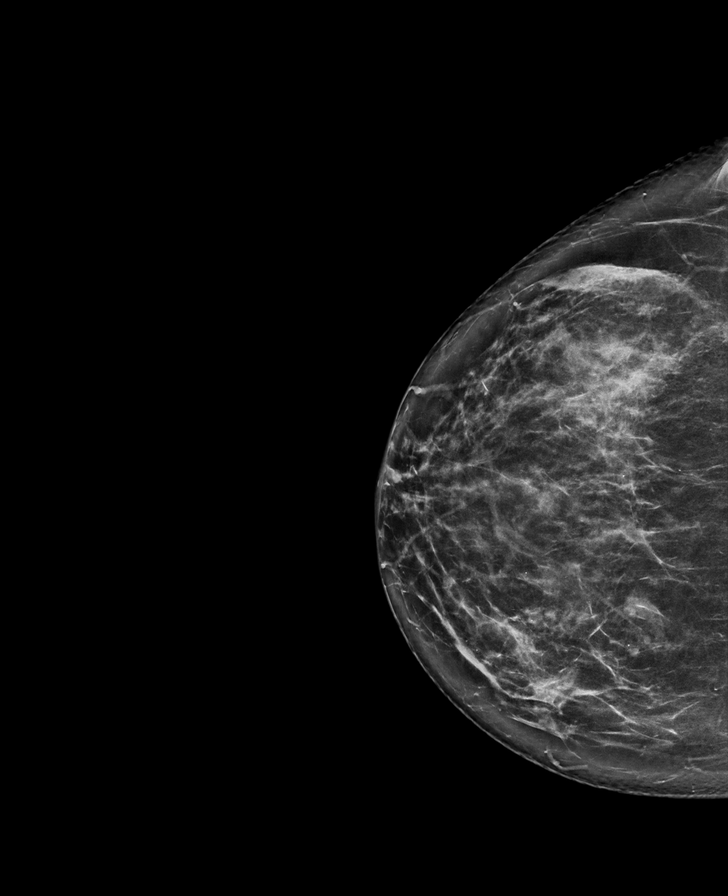

[L CC synth-2D]
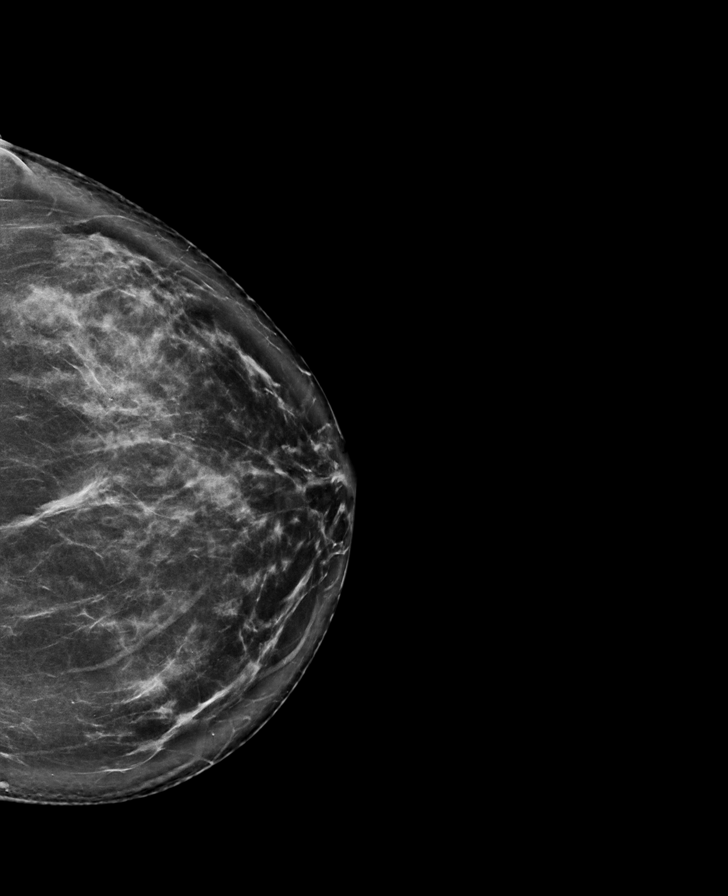

[R CC synth-2D (3 of 3)]
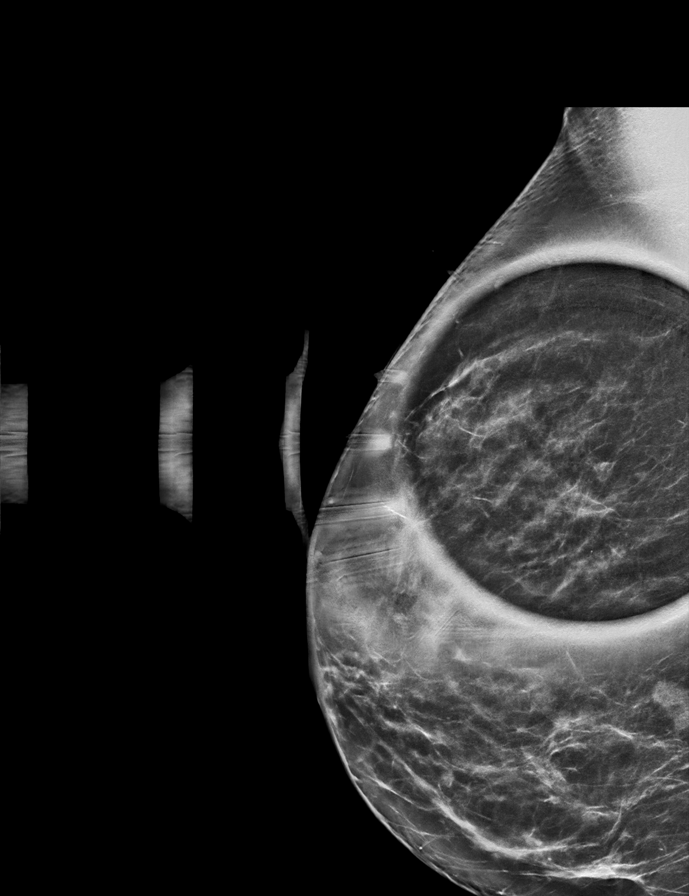

[R MLO synth-2D]
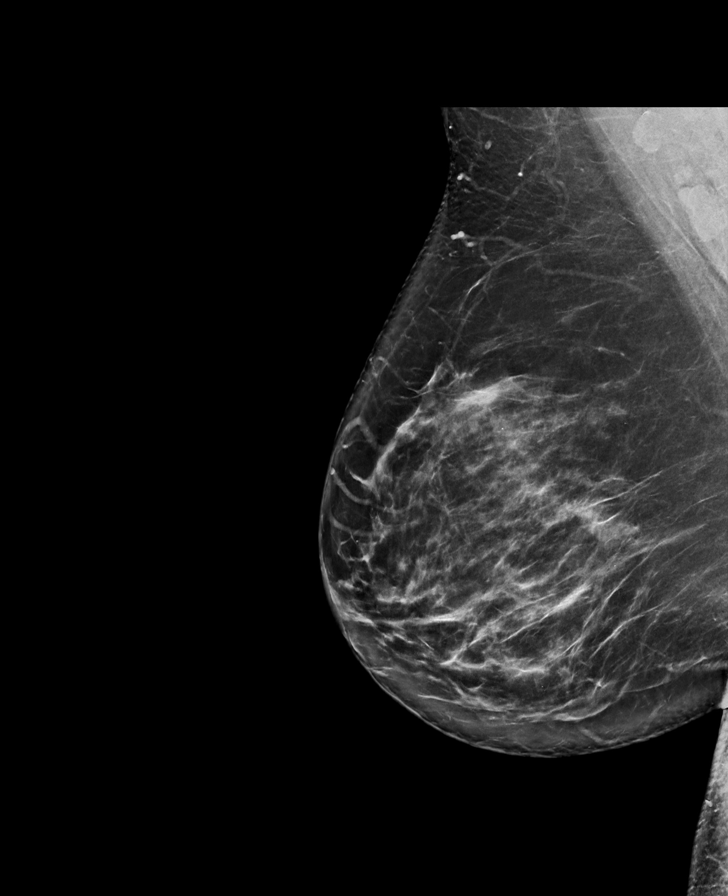

[R CC tomo · tomo slice 41/81.0]
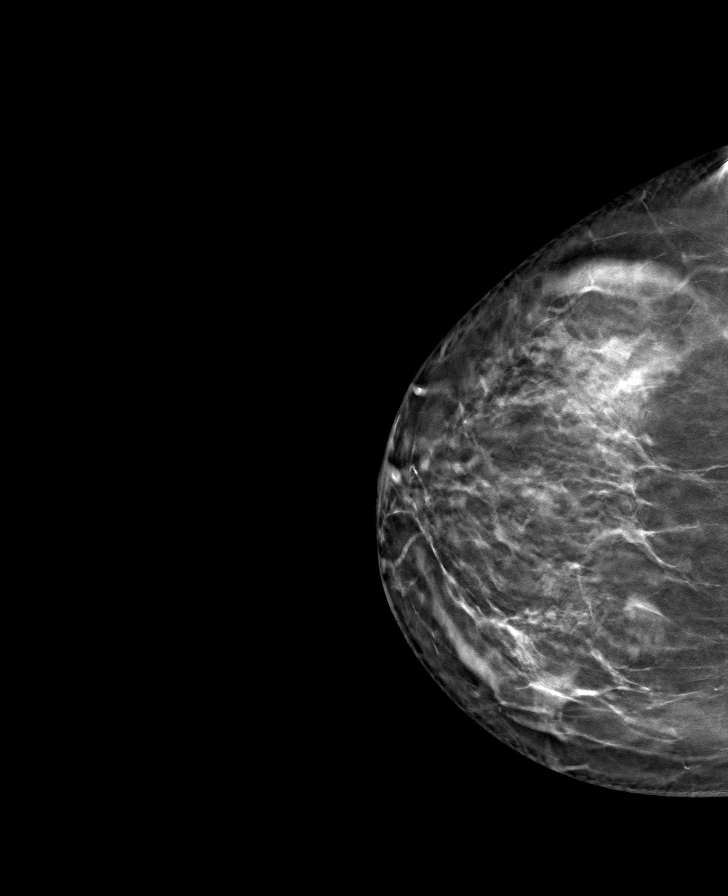

[8 of 40 positions shown; findings below may reference images not displayed]

ACR Breast Density Category c: The breast tissue is heterogeneously
dense, which may obscure small masses.
FINDINGS: Mammogram:

Right breast: The previously seen small mass within the upper inner
right breast is less conspicuous on today's imaging. There is no new
suspicious finding in the right breast.

Left breast: No suspicious mass, distortion, or microcalcifications
are identified to suggest presence of malignancy.

Ultrasound:

Targeted ultrasound performed in the left breast at 2 o'clock 3 cm
from the nipple demonstrating an oval hypoechoic mass measuring
x 0.2 x 0.4 cm, likely a cluster of cysts. This previously measured
0.5 x 0.2 x 0.5 cm. There is an incidentally noted adjacent tiny
benign simple cyst.
IMPRESSION: 1. Stable probably benign right breast mass at 2 o'clock, likely a
complicated cyst.

2.  No mammographic evidence of malignancy in the left breast.

RECOMMENDATION:
Diagnostic bilateral mammogram and right breast ultrasound in 1
year.

I have discussed the findings and recommendations with the patient.
If applicable, a reminder letter will be sent to the patient
regarding the next appointment.

BI-RADS CATEGORY  3: Probably benign.
